# Patient Record
Sex: Female | Born: 1965 | Race: Asian | Hispanic: No | Marital: Married | Smoking: Former smoker
Health system: Southern US, Community
[De-identification: ages and names within clinical notes are randomized; demographics above are authoritative.]

## PROBLEM LIST (undated history)

## (undated) DIAGNOSIS — E079 Disorder of thyroid, unspecified: Secondary | ICD-10-CM

## (undated) DIAGNOSIS — K449 Diaphragmatic hernia without obstruction or gangrene: Secondary | ICD-10-CM

## (undated) DIAGNOSIS — K219 Gastro-esophageal reflux disease without esophagitis: Secondary | ICD-10-CM

## (undated) HISTORY — DX: Gastro-esophageal reflux disease without esophagitis: K21.9

## (undated) HISTORY — DX: Diaphragmatic hernia without obstruction or gangrene: K44.9

## (undated) HISTORY — DX: Disorder of thyroid, unspecified: E07.9

---

## 2019-06-17 DIAGNOSIS — M65312 Trigger thumb, left thumb: Secondary | ICD-10-CM | POA: Diagnosis not present

## 2019-10-07 ENCOUNTER — Ambulatory Visit: Payer: BC Managed Care – PPO | Attending: Internal Medicine

## 2019-10-07 DIAGNOSIS — Z23 Encounter for immunization: Secondary | ICD-10-CM

## 2019-10-07 NOTE — Progress Notes (Signed)
   Covid-19 Vaccination Clinic  Name:  Kenniyah Sasaki    MRN: [id number] DOB: [birth year]  10/07/2019  Ms. Strawder was observed post Covid-19 immunization for 15 minutes without incident. She was provided with Vaccine Information Sheet and instruction to access the V-Safe system.   Ms. Nutter was instructed to call 911 with any severe reactions post vaccine: Marland Kitchen Difficulty breathing  . Swelling of face and throat  . A fast heartbeat  . A bad rash all over body  . Dizziness and weakness   Immunizations Administered    Name Date Dose VIS Date Route   Pfizer COVID-19 Vaccine 10/07/2019 11:14 AM 0.3 mL 06/17/2019 Intramuscular   Manufacturer: [employer], [employer]   Lot: [id number]   NDC: [address]-4

## 2019-10-31 ENCOUNTER — Ambulatory Visit: Payer: BC Managed Care – PPO | Attending: Internal Medicine

## 2019-10-31 DIAGNOSIS — Z23 Encounter for immunization: Secondary | ICD-10-CM

## 2019-10-31 NOTE — Progress Notes (Signed)
   Covid-19 Vaccination Clinic  Name:  Joy Cohen    MRN: [id number] DOB: 25-Dec-1965  10/31/2019  [name] was observed post Covid-19 immunization for 15 minutes without incident. She was provided with Vaccine Information Sheet and instruction to access the V-Safe system.   [name] was instructed to call 911 with any severe reactions post vaccine: Marland Kitchen Difficulty breathing  . Swelling of face and throat  . A fast heartbeat  . A bad rash all over body  . Dizziness and weakness   Immunizations Administered    Name Date Dose VIS Date Route   Pfizer COVID-19 Vaccine 10/31/2019  1:05 PM 0.3 mL 08/31/2018 Intramuscular   Manufacturer: [employer], [employer]   Lot: [id number]   NDC: [address]-3

## 2020-02-28 DIAGNOSIS — S90862A Insect bite (nonvenomous), left foot, initial encounter: Secondary | ICD-10-CM | POA: Diagnosis not present

## 2020-04-19 DIAGNOSIS — Z03818 Encounter for observation for suspected exposure to other biological agents ruled out: Secondary | ICD-10-CM | POA: Diagnosis not present

## 2020-05-06 ENCOUNTER — Encounter (HOSPITAL_BASED_OUTPATIENT_CLINIC_OR_DEPARTMENT_OTHER): Payer: Self-pay | Admitting: Emergency Medicine

## 2020-05-06 ENCOUNTER — Emergency Department (HOSPITAL_BASED_OUTPATIENT_CLINIC_OR_DEPARTMENT_OTHER)
Admission: EM | Admit: 2020-05-06 | Discharge: 2020-05-07 | Disposition: A | Discharge status: Home / Self Care | Payer: BC Managed Care – PPO | Attending: Emergency Medicine | Admitting: Emergency Medicine

## 2020-05-06 ENCOUNTER — Other Ambulatory Visit: Payer: Self-pay

## 2020-05-06 DIAGNOSIS — Y92410 Unspecified street and highway as the place of occurrence of the external cause: Secondary | ICD-10-CM | POA: Diagnosis not present

## 2020-05-06 DIAGNOSIS — R079 Chest pain, unspecified: Secondary | ICD-10-CM | POA: Diagnosis not present

## 2020-05-06 DIAGNOSIS — S3992XA Unspecified injury of lower back, initial encounter: Secondary | ICD-10-CM | POA: Diagnosis not present

## 2020-05-06 DIAGNOSIS — S39012A Strain of muscle, fascia and tendon of lower back, initial encounter: Secondary | ICD-10-CM | POA: Diagnosis not present

## 2020-05-06 DIAGNOSIS — R0789 Other chest pain: Secondary | ICD-10-CM | POA: Diagnosis not present

## 2020-05-06 MED ORDER — IBUPROFEN 400 MG PO TABS
600.0000 mg | ORAL_TABLET | Freq: Once | ORAL | Status: AC
  Administered 2020-05-07: 600 mg via ORAL
  Filled 2020-05-06: qty 1

## 2020-05-06 MED ORDER — CYCLOBENZAPRINE HCL 5 MG PO TABS
5.0000 mg | ORAL_TABLET | Freq: Once | ORAL | Status: AC
  Administered 2020-05-07: 5 mg via ORAL
  Filled 2020-05-06: qty 1
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
## 2020-05-07 ENCOUNTER — Emergency Department (HOSPITAL_BASED_OUTPATIENT_CLINIC_OR_DEPARTMENT_OTHER): Payer: BC Managed Care – PPO

## 2020-05-07 DIAGNOSIS — R079 Chest pain, unspecified: Secondary | ICD-10-CM | POA: Diagnosis not present

## 2020-05-07 MED ORDER — IBUPROFEN 600 MG PO TABS: 600.0000 mg | ORAL_TABLET | Freq: Four times a day (QID) | ORAL | 0 refills | Status: DC | PRN

## 2020-05-07 MED ORDER — CYCLOBENZAPRINE HCL 5 MG PO TABS: 5.0000 mg | ORAL_TABLET | Freq: Two times a day (BID) | ORAL | 0 refills | Status: DC | PRN
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
## 2020-06-05 DIAGNOSIS — M545 Low back pain, unspecified: Secondary | ICD-10-CM | POA: Diagnosis not present

## 2020-06-14 DIAGNOSIS — M9904 Segmental and somatic dysfunction of sacral region: Secondary | ICD-10-CM | POA: Diagnosis not present

## 2020-06-14 DIAGNOSIS — M9903 Segmental and somatic dysfunction of lumbar region: Secondary | ICD-10-CM | POA: Diagnosis not present

## 2020-06-14 DIAGNOSIS — M9905 Segmental and somatic dysfunction of pelvic region: Secondary | ICD-10-CM | POA: Diagnosis not present

## 2020-06-14 DIAGNOSIS — M6240 Contracture of muscle, unspecified site: Secondary | ICD-10-CM | POA: Diagnosis not present

## 2020-06-19 DIAGNOSIS — M9904 Segmental and somatic dysfunction of sacral region: Secondary | ICD-10-CM | POA: Diagnosis not present

## 2020-06-19 DIAGNOSIS — M6240 Contracture of muscle, unspecified site: Secondary | ICD-10-CM | POA: Diagnosis not present

## 2020-06-19 DIAGNOSIS — M9905 Segmental and somatic dysfunction of pelvic region: Secondary | ICD-10-CM | POA: Diagnosis not present

## 2020-06-19 DIAGNOSIS — M9903 Segmental and somatic dysfunction of lumbar region: Secondary | ICD-10-CM | POA: Diagnosis not present

## 2020-07-02 DIAGNOSIS — M25551 Pain in right hip: Secondary | ICD-10-CM | POA: Diagnosis not present

## 2020-07-02 DIAGNOSIS — M545 Low back pain, unspecified: Secondary | ICD-10-CM | POA: Diagnosis not present

## 2020-12-27 DIAGNOSIS — R5383 Other fatigue: Secondary | ICD-10-CM | POA: Diagnosis not present

## 2020-12-27 DIAGNOSIS — R0789 Other chest pain: Secondary | ICD-10-CM | POA: Diagnosis not present

## 2021-01-09 ENCOUNTER — Other Ambulatory Visit: Payer: Self-pay | Admitting: Family Medicine

## 2021-01-09 ENCOUNTER — Other Ambulatory Visit: Payer: Self-pay

## 2021-01-09 ENCOUNTER — Ambulatory Visit
Admission: RE | Admit: 2021-01-09 | Discharge: 2021-01-09 | Disposition: A | Discharge status: Home / Self Care | Payer: BC Managed Care – PPO | Source: Ambulatory Visit | Attending: Family Medicine | Admitting: Family Medicine

## 2021-01-09 DIAGNOSIS — R1031 Right lower quadrant pain: Secondary | ICD-10-CM | POA: Diagnosis not present

## 2021-01-09 DIAGNOSIS — K449 Diaphragmatic hernia without obstruction or gangrene: Secondary | ICD-10-CM | POA: Diagnosis not present

## 2021-01-09 DIAGNOSIS — R5383 Other fatigue: Secondary | ICD-10-CM | POA: Diagnosis not present

## 2021-01-09 DIAGNOSIS — R946 Abnormal results of thyroid function studies: Secondary | ICD-10-CM | POA: Diagnosis not present

## 2021-01-09 DIAGNOSIS — R0789 Other chest pain: Secondary | ICD-10-CM | POA: Diagnosis not present

## 2021-01-09 DIAGNOSIS — M5127 Other intervertebral disc displacement, lumbosacral region: Secondary | ICD-10-CM | POA: Diagnosis not present

## 2021-01-09 MED ORDER — IOPAMIDOL (ISOVUE-300) INJECTION 61%
100.0000 mL | Freq: Once | INTRAVENOUS | Status: AC | PRN
  Administered 2021-01-09: 100 mL via INTRAVENOUS

## 2021-01-09 MED ORDER — IOPAMIDOL (ISOVUE-300) INJECTION 61%: 75.0000 mL | Freq: Once | INTRAVENOUS | Status: DC | PRN

## 2021-01-14 NOTE — Progress Notes (Deleted)
Date:  01/14/2021   ID:  Joy Cohen, DOB [birth year], MRN [id number]  PCP:  Clayborn Heron, MD  Cardiologist: Tessa Lerner, DO, [facility]  (established care 01/15/2021) Former Cardiology Providers: ***  REASON FOR CONSULT: Chest pain, shortness of breath  REQUESTING PHYSICIAN:  Rankins, Fanny Dance, MD [address] [place],  [place] [address]  No chief complaint on file.   HPI  Joy Cohen is a 55 y.o. female who presents to the office with a chief complaint of "***." Patient's past medical history and cardiovascular risk factors include: ***  She is referred to the office at the request of Rankins, Fanny Dance, MD for evaluation of chest pain, shortness of breath.  ***  History of  Denies prior history of coronary artery disease, myocardial infarction, congestive heart failure, deep venous thrombosis, pulmonary embolism, stroke, transient ischemic attack.  FUNCTIONAL STATUS: ***   ALLERGIES: Not on File  MEDICATION LIST PRIOR TO VISIT: No outpatient medications have been marked as taking for the 01/15/21 encounter (Appointment) with Tessa Lerner, DO.     PAST MEDICAL HISTORY: No past medical history on file.  PAST SURGICAL HISTORY: No past surgical history on file.  FAMILY HISTORY: The patient family history is not on file.  SOCIAL HISTORY:  The patient  reports that she has never smoked. She has never used smokeless tobacco. She reports that she does not drink alcohol and does not use drugs.  REVIEW OF SYSTEMS: ROS  PHYSICAL EXAM: Vitals with BMI 05/07/2020 05/06/2020  Height - 5\' 1"   Weight - 149 lbs  BMI - 28.17  Systolic 132 143  Diastolic 81 90  Pulse 61 67    CONSTITUTIONAL: Well-developed and well-nourished. No acute distress.  SKIN: Skin is warm and dry. No rash noted. No cyanosis. No pallor. No jaundice HEAD: Normocephalic and atraumatic.  EYES: No scleral icterus MOUTH/THROAT: Moist oral membranes.  NECK: No JVD present.  No thyromegaly noted. No carotid bruits  LYMPHATIC: No visible cervical adenopathy.  CHEST Normal respiratory effort. No intercostal retractions  LUNGS: *** No stridor. No wheezes. No rales.  CARDIOVASCULAR: *** ABDOMINAL: No apparent ascites.  EXTREMITIES: No peripheral edema  HEMATOLOGIC: No significant bruising NEUROLOGIC: Oriented to person, place, and time. Nonfocal. Normal muscle tone.  PSYCHIATRIC: Normal mood and affect. Normal behavior. Cooperative  CARDIAC DATABASE: EKG: 05/08/2020:   Echocardiogram: No results found for this or any previous visit from the past 1095 days.    Stress Testing: No results found for this or any previous visit from the past 1095 days.   Heart Catheterization: ***  LABORATORY DATA: No flowsheet data found.  No flowsheet data found.  Lipid Panel  No results found for: CHOL, TRIG, HDL, CHOLHDL, VLDL, LDLCALC, LDLDIRECT, LABVLDL  No components found for: NTPROBNP No results for input(s): PROBNP in the last 8760 hours. No results for input(s): TSH in the last 8760 hours.  BMP No results for input(s): NA, K, CL, CO2, GLUCOSE, BUN, CREATININE, CALCIUM, GFRNONAA, GFRAA in the last 8760 hours.  HEMOGLOBIN A1C No results found for: HGBA1C, MPG  IMPRESSION:  No diagnosis found.   RECOMMENDATIONS: Andersyn Fragoso is a 55 y.o. female whose past medical history and cardiac risk factors include: ***   FINAL MEDICATION LIST END OF ENCOUNTER: No orders of the defined types were placed in this encounter.   There are no discontinued medications.   Current Outpatient Medications:    cyclobenzaprine (FLEXERIL) 5 MG tablet, Take 1 tablet (5 mg  total) by mouth 2 (two) times daily as needed for muscle spasms., Disp: 10 tablet, Rfl: 0   ibuprofen (ADVIL) 600 MG tablet, Take 1 tablet (600 mg total) by mouth every 6 (six) hours as needed., Disp: 30 tablet, Rfl: 0  No orders of the defined types were placed in this encounter.   There are  no Patient Instructions on file for this visit.   --Continue cardiac medications as reconciled in final medication list. --No follow-ups on file. Or sooner if needed. --Continue follow-up with your primary care physician regarding the management of your other chronic comorbid conditions.  Patient's questions and concerns were addressed to her satisfaction. She voices understanding of the instructions provided during this encounter.   This note was created using a voice recognition software as a result there may be grammatical errors inadvertently enclosed that do not reflect the nature of this encounter. Every attempt is made to correct such errors.  Tessa Lerner, [place], [facility]  Pager: [phone] Office: [phone]

## 2021-01-15 ENCOUNTER — Ambulatory Visit: Payer: BC Managed Care – PPO | Admitting: Cardiology

## 2021-01-15 ENCOUNTER — Other Ambulatory Visit: Payer: Self-pay

## 2021-01-15 ENCOUNTER — Telehealth: Payer: BC Managed Care – PPO | Admitting: Cardiology

## 2021-01-15 ENCOUNTER — Encounter: Payer: Self-pay | Admitting: Cardiology

## 2021-01-15 VITALS — BP 129/71 | HR 61 | Ht 61.0 in | Wt 148.0 lb

## 2021-01-15 DIAGNOSIS — Z87891 Personal history of nicotine dependence: Secondary | ICD-10-CM

## 2021-01-15 DIAGNOSIS — E0590 Thyrotoxicosis, unspecified without thyrotoxic crisis or storm: Secondary | ICD-10-CM | POA: Diagnosis not present

## 2021-01-15 DIAGNOSIS — R072 Precordial pain: Secondary | ICD-10-CM | POA: Diagnosis not present

## 2021-01-15 DIAGNOSIS — R0602 Shortness of breath: Secondary | ICD-10-CM

## 2021-01-15 NOTE — Progress Notes (Signed)
Joy Cohen Date of Birth: 1965/11/06 MRN: [id number] Primary Care Provider:Rankins, Bill Salinas, MD Primary Cardiologist: Rex Kras, DO, [facility] (established care 01/15/2021)  Reason of consultation: Chest pain and shortness of breath and decreased exercise endurance  Date: 01/15/21  Cardiology Video Visit via Telehealth Patient encounter was conducted via Video Visit with real-time audio and video communication.  The patient has been advised of the potential risks and limitations of this mode of treatment.  The patient has also been advised to contact this office for worsening conditions or problems, and seek emergency medical treatment and/or call 911 if the patient deems either necessary.  Patient Location: Home Provider Location: Office/Clinic  Chief Complaint  Patient presents with   Shortness of Breath   Chest Pain   Fatigue    HPI  Joy Cohen is a 55 y.o.  female evaluated today via a video visit with a chief complaint of "Chest pain, shortness of breath." Patient's past medical history and cardiovascular risk factors include: History of thyrotoxicosis, former smoker, GERD, Hiatal hernia.   Chest pain: Symptoms of chest pain have been present for the last 3 weeks at least.  She describes the discomfort as a pressure-like sensation located substernally, it last for several hours if not all day, the intensity is 0-1 out of 10, not brought on by effort related activities but at times brought on by stressful situations, and does not resolve with rest is usually self-limited.  She is also noticed that when she walks her dog twice a day for 30 minutes each she has been experiencing more dyspnea on exertion.  She has underlying hyperthyroidism and most recent TSH was 0.18.  She used to take medications for her hyperthyroidism but recently had discontinued as she was focusing on lifestyle changes.  She has an upcoming appointment with endocrinology for further  evaluation and management.  No prior history of coronary artery disease.  FUNCTIONAL STATUS: Walks 60 minutes a day.    ALLERGIES: No Known Allergies   MEDICATION LIST PRIOR TO VISIT: Current Outpatient Medications on File Prior to Visit  Medication Sig Dispense Refill   Ascorbic Acid (VITAMIN C) 1000 MG tablet Take 1,000 mg by mouth daily.     Zinc 50 MG TABS Take 1 tablet by mouth daily at 6 (six) AM.     No current facility-administered medications on file prior to visit.   Pre-visit medications were reviewed and reconciled by rooming nurse, and independently reviewed and reconciled by me at the onset of this encounter. Any new changes in medication names or instructions are found in Final Medication List  PAST MEDICAL HISTORY: Past Medical History:  Diagnosis Date   Thyroid disease     PAST SURGICAL HISTORY: Past Surgical History:  Procedure Laterality Date   CESAREAN SECTION      FAMILY HISTORY: No family history of premature coronary disease or sudden cardiac death.   SOCIAL HISTORY:  The patient  reports that she quit smoking about 27 years ago. Her smoking use included cigarettes. She has a 5.00 pack-year smoking history. She has never used smokeless tobacco. She reports that she does not drink alcohol and does not use drugs.  Review of Systems  Constitutional: Negative for chills and fever.  HENT:  Negative for hoarse voice and nosebleeds.   Eyes:  Negative for discharge, double vision and pain.  Cardiovascular:  Positive for chest pain and dyspnea on exertion. Negative for claudication, leg swelling, near-syncope, orthopnea, palpitations, paroxysmal nocturnal dyspnea and  syncope.  Respiratory:  Positive for shortness of breath. Negative for hemoptysis.   Musculoskeletal:  Negative for muscle cramps and myalgias.  Gastrointestinal:  Negative for abdominal pain, constipation, diarrhea, hematemesis, hematochezia, melena, nausea and vomiting.  Neurological:   Negative for dizziness and light-headedness.   PHYSICAL EXAM: The following examination entries are based on visual observation of the patient via telemedicine video: CONSTITUTIONAL: No acute distress by facial expression or demeanor during the telemedicine video.  SKIN: No apparent cyanosis, pallor, jaundice of the face by telemedicine video. HEAD: Normocephalic and atraumatic by telemedicine video.  EYES: No apparent scleral icterus by telemedicine video. MOUTH/THROAT: Lips do not appear to be dry during telemedicine video. NECK: No apparent JVD or thyromegaly noted on observation of neck during telemedicine video. LUNGS: No audible stridor or wheezing noted during telemedicine audio. CARDIOVASCULAR: Not examined during telemedicine video NEUROLOGIC: No apparent focal asymmetry of face noted during telemedicine video PSYCHIATRIC: Normal mood and affect, normal behavior, cooperative during telemedicine video  CARDIAC DATABASE: EKG: 12/27/2020: (provided by PCP) Normal sinus rhythm, 64 bpm, normal axis, without underlying ischemia or injury pattern.  Echocardiogram: No results found for this or any previous visit from the past 1095 days.    Stress Testing:  No results found for this or any previous visit from the past 1095 days.   Heart Catheterization: None  LABORATORY DATA: No flowsheet data found.  No flowsheet data found.  Lipid Panel  No results found for: CHOL, TRIG, HDL, CHOLHDL, VLDL, LDLCALC, LDLDIRECT, LABVLDL  No results found for: HGBA1C No components found for: NTPROBNP No results found for: TSH  Cardiac Panel (last 3 results) No results for input(s): CKTOTAL, CKMB, TROPONINIHS, RELINDX in the last 72 hours.  External Labs: Collected: 12/27/2020 provided by PCP Creatinine 0.65 mg/dL. eGFR: >60 mL/min per 1.73 m Potassium 4.3 Hemoglobin 14.4 g/dL, hematocrit 42.1% TSH: 0.18  IMPRESSION:    ICD-10-CM   1. Precordial pain  R07.2     2. SOB  (shortness of breath)  R06.02     3. Hyperthyroidism  E05.90     4. Former cigarette smoker  Z87.891        RECOMMENDATIONS: [name] is a 55 y.o. female whose past medical history and cardiovascular risk factors include: History of thyrotoxicosis, former smoker, GERD, Hiatal hernia.   Of note, patient was suppose to come into the office for consultation.  However, her husband's been having COVID-like symptoms and therefore the visit was changed to telehealth to decrease exposure for possible COVID-19 infection.  Precordial pain: Her symptoms can possibly be cardiac in etiology and therefore would recommend additional work-up. Initially recommended coronary CTA to evaluate for coronary calcium as well as obstructive CAD.  However patient states that she recently had a CT of the abdomen and did not tolerate the test well. Echocardiogram will be ordered to evaluate for structural heart disease and left ventricular systolic function. Plan exercise treadmill stress test we will screen for COVID prior to her GXT. Coronary artery calcium scoring  Dyspnea on exertion: Ischemic evaluation as outlined above. Check NT proBNP. EKG on arrival at the next office visit  Hyperthyroidism: Patient plans to follow-up with endocrinology in the coming week.  Given her age would recommend checking a fasting lipid profile and a CMP prior to next office visit to help further risk stratify the patient from a cardiovascular standpoint.  FINAL MEDICATION LIST END OF ENCOUNTER: No orders of the defined types were placed in this encounter.  Medications Discontinued During This Encounter  Medication Reason   cyclobenzaprine (FLEXERIL) 5 MG tablet Error   ibuprofen (ADVIL) 600 MG tablet Error     Current Outpatient Medications:    Ascorbic Acid (VITAMIN C) 1000 MG tablet, Take 1,000 mg by mouth daily., Disp: , Rfl:    Zinc 50 MG TABS, Take 1 tablet by mouth daily at 6 (six) AM., Disp: , Rfl:    No orders of the defined types were placed in this encounter.   Time:   Today, I have spent 47 minutes with the patient with telehealth technology discussing the above problems. Review of outside records provided by her PCP as part of this consultation.  Independently reviewed labs.  Discussed the differential diagnosis of her presenting symptoms.  Going over the modalities of diagnostic testing, medical decision making, and coordination of care.  --Continue cardiac medications as reconciled in final medication list. --Return in about 4 weeks (around 02/12/2021) for Reevaluation of chest pain and shortness of breath and review test results. Or sooner if needed. --Continue follow-up with your primary care physician regarding the management of your other chronic comorbid conditions.  Patient's questions and concerns were addressed to her satisfaction. She voices understanding of the instructions provided during this encounter.   This note was created using a voice recognition software as a result there may be grammatical errors inadvertently enclosed that do not reflect the nature of this encounter. Every attempt is made to correct such errors.  Rex Kras, [place], [facility]  Pager: [phone] Office: [phone]

## 2021-01-22 ENCOUNTER — Other Ambulatory Visit: Payer: Self-pay

## 2021-01-22 DIAGNOSIS — R0602 Shortness of breath: Secondary | ICD-10-CM

## 2021-01-24 DIAGNOSIS — E0500 Thyrotoxicosis with diffuse goiter without thyrotoxic crisis or storm: Secondary | ICD-10-CM | POA: Diagnosis not present

## 2021-01-24 DIAGNOSIS — E0590 Thyrotoxicosis, unspecified without thyrotoxic crisis or storm: Secondary | ICD-10-CM | POA: Diagnosis not present

## 2021-01-24 DIAGNOSIS — R946 Abnormal results of thyroid function studies: Secondary | ICD-10-CM | POA: Diagnosis not present

## 2021-01-25 DIAGNOSIS — M9905 Segmental and somatic dysfunction of pelvic region: Secondary | ICD-10-CM | POA: Diagnosis not present

## 2021-01-25 DIAGNOSIS — M9903 Segmental and somatic dysfunction of lumbar region: Secondary | ICD-10-CM | POA: Diagnosis not present

## 2021-01-25 DIAGNOSIS — M6240 Contracture of muscle, unspecified site: Secondary | ICD-10-CM | POA: Diagnosis not present

## 2021-01-31 ENCOUNTER — Other Ambulatory Visit: Payer: Self-pay

## 2021-01-31 ENCOUNTER — Encounter: Payer: Self-pay | Admitting: Cardiology

## 2021-01-31 ENCOUNTER — Ambulatory Visit: Payer: BC Managed Care – PPO | Admitting: Cardiology

## 2021-01-31 VITALS — BP 106/67 | HR 66 | Temp 97.9°F | Resp 16 | Ht 61.0 in | Wt 149.0 lb

## 2021-01-31 DIAGNOSIS — R0602 Shortness of breath: Secondary | ICD-10-CM | POA: Diagnosis not present

## 2021-01-31 DIAGNOSIS — Z87891 Personal history of nicotine dependence: Secondary | ICD-10-CM | POA: Diagnosis not present

## 2021-01-31 DIAGNOSIS — R072 Precordial pain: Secondary | ICD-10-CM

## 2021-01-31 DIAGNOSIS — E0580 Other thyrotoxicosis without thyrotoxic crisis or storm: Secondary | ICD-10-CM

## 2021-01-31 NOTE — Progress Notes (Signed)
Date:  01/31/2021   ID:  [name], DOB 07-28-1965, MRN [id number]  PCP:  Aretta Nip, MD  Cardiologist:  Rex Kras, DO, [facility] (established care 01/15/2021)  Date: 01/31/21 Last Office Visit: 01/15/2021   Chief Complaint  Patient presents with   Precordial pain   Shortness of Breath    HPI  Joy Cohen is a 55 y.o. female who presents to the office with a chief complaint of "chest discomfort and shortness of breath." Patient's past medical history and cardiovascular risk factors include: History of thyrotoxicosis, currently hyperthyroidism, former smoker, GERD, Hiatal hernia.  She is referred to the office at the request of Rankins, Bill Salinas, MD for evaluation of Chest pain and shortness of breath.  Chest discomfort: Patient states that she continues to have epigastric discomfort, intensity 1 out of 10, lasting for a few seconds, not brought on by effort related activities, does not resolve with rest.  Patient states that symptoms usually improve after massaging that area for a few seconds.  She continues to walk her dog at least twice a day without a decrease in physical endurance.  At times she does experience effort related dyspnea.  During her last virtual visit she was recommended to undergo an echocardiogram, GXT, and coronary calcium score; however, they are all pending.  We initially considered doing a coronary CTA given her symptoms however, patient did not want to proceed forward because she did not feel well after recent CT of the abdomen.  FUNCTIONAL STATUS: Walks her dog twice a day for total of 60 minutes.    ALLERGIES: No Known Allergies  MEDICATION LIST PRIOR TO VISIT: Current Meds  Medication Sig   Ascorbic Acid (VITAMIN C) 1000 MG tablet Take 1,000 mg by mouth daily.   methimazole (TAPAZOLE) 5 MG tablet Take 1 tablet by mouth daily at 12 noon.   Zinc 50 MG TABS Take 1 tablet by mouth daily at 6 (six) AM.     PAST MEDICAL  HISTORY: Past Medical History:  Diagnosis Date   GERD (gastroesophageal reflux disease)    Hiatal hernia    Thyroid disease     PAST SURGICAL HISTORY: Past Surgical History:  Procedure Laterality Date   CESAREAN SECTION      FAMILY HISTORY: The patient family history includes Stroke in her father. No family history of premature coronary disease or sudden cardiac death.   SOCIAL HISTORY:  The patient  reports that she quit smoking about 27 years ago. Her smoking use included cigarettes. She has a 5.00 pack-year smoking history. She has never used smokeless tobacco. She reports that she does not drink alcohol and does not use drugs.  REVIEW OF SYSTEMS: Review of Systems  Constitutional: Negative for chills and fever.  HENT:  Negative for hoarse voice and nosebleeds.   Eyes:  Negative for discharge, double vision and pain.  Cardiovascular:  Positive for chest pain. Negative for claudication, dyspnea on exertion, leg swelling, near-syncope, orthopnea, palpitations, paroxysmal nocturnal dyspnea and syncope.  Respiratory:  Positive for shortness of breath. Negative for hemoptysis.   Musculoskeletal:  Negative for muscle cramps and myalgias.  Gastrointestinal:  Negative for abdominal pain, constipation, diarrhea, hematemesis, hematochezia, melena, nausea and vomiting.  Neurological:  Negative for dizziness and light-headedness.   PHYSICAL EXAM: Vitals with BMI 01/31/2021 01/15/2021 05/07/2020  Height 5' 1"  5' 1"  -  Weight 149 lbs 148 lbs -  BMI 38.46 65.99 -  Systolic 357 017 793  Diastolic 67 71 81  Pulse 66 61 61    CONSTITUTIONAL: Well-developed and well-nourished. No acute distress.  SKIN: Skin is warm and dry. No rash noted. No cyanosis. No pallor. No jaundice HEAD: Normocephalic and atraumatic.  EYES: No scleral icterus MOUTH/THROAT: Moist oral membranes.  NECK: No JVD present. No thyromegaly noted. No carotid bruits  LYMPHATIC: No visible cervical adenopathy.  CHEST  Normal respiratory effort. No intercostal retractions  LUNGS: Clear to auscultation bilaterally.  No stridor. No wheezes. No rales.  CARDIOVASCULAR: Regular rate and rhythm, positive S1-S2, no murmurs rubs or gallops appreciated. ABDOMINAL: Nonobese, soft, nontender, nondistended, positive bowel sounds in all 4 quadrants, no apparent ascites.  EXTREMITIES: No peripheral edema, 2+ DP and PT pulses bilaterally. HEMATOLOGIC: No significant bruising NEUROLOGIC: Oriented to person, place, and time. Nonfocal. Normal muscle tone.  PSYCHIATRIC: Normal mood and affect. Normal behavior. Cooperative  CARDIAC DATABASE: EKG: 01/31/2021: Sinus bradycardia, 58 bpm, normal axis, without underlying ischemia or injury pattern.   Echocardiogram: No results found for this or any previous visit from the past 1095 days.   Stress Testing: No results found for this or any previous visit from the past 1095 days.  Heart Catheterization: None  LABORATORY DATA: No flowsheet data found.  No flowsheet data found.  Lipid Panel  No results found for: CHOL, TRIG, HDL, CHOLHDL, VLDL, LDLCALC, LDLDIRECT, LABVLDL  No components found for: NTPROBNP No results for input(s): PROBNP in the last 8760 hours. No results for input(s): TSH in the last 8760 hours.  BMP No results for input(s): NA, K, CL, CO2, GLUCOSE, BUN, CREATININE, CALCIUM, GFRNONAA, GFRAA in the last 8760 hours.  HEMOGLOBIN A1C No results found for: HGBA1C, MPG  External Labs: Collected: 12/27/2020 provided by PCP Creatinine 0.65 mg/dL. eGFR: >60 mL/min per 1.73 m Potassium 4.3 Hemoglobin 14.4 g/dL, hematocrit 42.1% TSH: 0.18  IMPRESSION:    ICD-10-CM   1. Precordial pain  R07.2 EKG 12-Lead    2. SOB (shortness of breath)  R06.02     3. Iatrogenic hyperthyroidism  E05.80     4. Former cigarette smoker  Z87.891        RECOMMENDATIONS: Joy Cohen is a 55 y.o. female whose past medical history and cardiac risk factors  include: History of thyrotoxicosis, currently hyperthyroidism, former smoker, GERD, Hiatal hernia.  Precordial pain: Patient symptoms have not worsened in intensity, frequency, and/or duration. Most likely noncardiac in etiology. Echocardiogram, exercise treadmill stress test, and coronary calcium score are currently pending.  Shortness of breath: Overall appears to be euvolemic and not in congestive heart failure. Check NT proBNP. Ischemic work-up as outlined above  Hyperthyroidism: Currently on methimazole, follows with endocrinology.  Former smoker: Educated on the importance of continued smoking cessation.  FINAL MEDICATION LIST END OF ENCOUNTER: No orders of the defined types were placed in this encounter.   There are no discontinued medications.   Current Outpatient Medications:    Ascorbic Acid (VITAMIN C) 1000 MG tablet, Take 1,000 mg by mouth daily., Disp: , Rfl:    methimazole (TAPAZOLE) 5 MG tablet, Take 1 tablet by mouth daily at 12 noon., Disp: , Rfl:    Zinc 50 MG TABS, Take 1 tablet by mouth daily at 6 (six) AM., Disp: , Rfl:   Orders Placed This Encounter  Procedures   EKG 12-Lead    There are no Patient Instructions on file for this visit.   --Continue cardiac medications as reconciled in final medication list. --Return in about 4 weeks (around 02/28/2021) for Follow up, Chest  pain, Dyspnea, Review test results. Or sooner if needed. --Continue follow-up with your primary care physician regarding the management of your other chronic comorbid conditions.  Patient's questions and concerns were addressed to her satisfaction. She voices understanding of the instructions provided during this encounter.   This note was created using a voice recognition software as a result there may be grammatical errors inadvertently enclosed that do not reflect the nature of this encounter. Every attempt is made to correct such errors.  Rex Kras, [place], [facility]  Pager:  [phone] Office: [phone]

## 2021-02-10 DIAGNOSIS — Z20822 Contact with and (suspected) exposure to covid-19: Secondary | ICD-10-CM | POA: Diagnosis not present

## 2021-02-11 ENCOUNTER — Other Ambulatory Visit: Payer: BC Managed Care – PPO

## 2021-02-25 DIAGNOSIS — M9903 Segmental and somatic dysfunction of lumbar region: Secondary | ICD-10-CM | POA: Diagnosis not present

## 2021-02-25 DIAGNOSIS — M6240 Contracture of muscle, unspecified site: Secondary | ICD-10-CM | POA: Diagnosis not present

## 2021-02-25 DIAGNOSIS — M9905 Segmental and somatic dysfunction of pelvic region: Secondary | ICD-10-CM | POA: Diagnosis not present

## 2021-02-26 ENCOUNTER — Ambulatory Visit: Payer: BC Managed Care – PPO

## 2021-02-26 ENCOUNTER — Other Ambulatory Visit: Payer: Self-pay

## 2021-02-26 DIAGNOSIS — R072 Precordial pain: Secondary | ICD-10-CM | POA: Diagnosis not present

## 2021-03-05 DIAGNOSIS — M9903 Segmental and somatic dysfunction of lumbar region: Secondary | ICD-10-CM | POA: Diagnosis not present

## 2021-03-05 DIAGNOSIS — M9905 Segmental and somatic dysfunction of pelvic region: Secondary | ICD-10-CM | POA: Diagnosis not present

## 2021-03-05 DIAGNOSIS — M6240 Contracture of muscle, unspecified site: Secondary | ICD-10-CM | POA: Diagnosis not present

## 2021-03-08 ENCOUNTER — Other Ambulatory Visit: Payer: Self-pay

## 2021-03-08 ENCOUNTER — Ambulatory Visit: Payer: BC Managed Care – PPO

## 2021-03-08 DIAGNOSIS — R072 Precordial pain: Secondary | ICD-10-CM | POA: Diagnosis not present

## 2021-03-12 NOTE — Progress Notes (Signed)
Called and spoke with patient regarding her echocardiogram and stress test results. Patient will be here on the 12th @ 11:30 for her appointment.

## 2021-03-12 NOTE — Progress Notes (Signed)
Called and spoke with patient regarding her echocardiogram and stress test results. Patient will be here on the 12th @ 11:30 for her appointment.

## 2021-03-14 DIAGNOSIS — M6240 Contracture of muscle, unspecified site: Secondary | ICD-10-CM | POA: Diagnosis not present

## 2021-03-14 DIAGNOSIS — M9905 Segmental and somatic dysfunction of pelvic region: Secondary | ICD-10-CM | POA: Diagnosis not present

## 2021-03-14 DIAGNOSIS — M9903 Segmental and somatic dysfunction of lumbar region: Secondary | ICD-10-CM | POA: Diagnosis not present

## 2021-03-18 ENCOUNTER — Other Ambulatory Visit: Payer: Self-pay

## 2021-03-18 ENCOUNTER — Ambulatory Visit: Payer: BC Managed Care – PPO | Admitting: Cardiology

## 2021-03-18 ENCOUNTER — Encounter: Payer: Self-pay | Admitting: Cardiology

## 2021-03-18 VITALS — BP 105/67 | HR 76 | Temp 98.0°F | Resp 17 | Ht 61.0 in | Wt 150.0 lb

## 2021-03-18 DIAGNOSIS — I340 Nonrheumatic mitral (valve) insufficiency: Secondary | ICD-10-CM | POA: Diagnosis not present

## 2021-03-18 DIAGNOSIS — R9439 Abnormal result of other cardiovascular function study: Secondary | ICD-10-CM | POA: Diagnosis not present

## 2021-03-18 DIAGNOSIS — Z87891 Personal history of nicotine dependence: Secondary | ICD-10-CM

## 2021-03-18 DIAGNOSIS — E0590 Thyrotoxicosis, unspecified without thyrotoxic crisis or storm: Secondary | ICD-10-CM

## 2021-03-18 DIAGNOSIS — R072 Precordial pain: Secondary | ICD-10-CM

## 2021-03-18 NOTE — Progress Notes (Signed)
Date:  03/18/2021   ID:  [name], DOB 09-08-1965, MRN [id number]  PCP:  Aretta Nip, MD  Cardiologist:  Rex Kras, DO, [facility] (established care 01/15/2021)  Date: 03/18/21 Last Office Visit: 01/31/2021  Chief Complaint  Patient presents with   Precordial pain   Follow-up   Results    Stress and echo    HPI  Joy Cohen is a 55 y.o. female who presents to the office with a chief complaint of "chest pain and review test results." Patient's past medical history and cardiovascular risk factors include: History of thyrotoxicosis, currently hyperthyroidism, former smoker, GERD, Hiatal hernia.  She is referred to the office at the request of Rankins, Bill Salinas, MD for evaluation of Chest pain and shortness of breath.  Patient was referred to the office for evaluation of chest pain and shortness of breath.  Due to the language barrier patient's husband is currently on FaceTime for further continuity of care.  During last office visit patient was complaining of epigastric discomfort, lasting for a few seconds, intensity 1 out of 10, usually associated with after eating.  The discomfort was getting attributed to heartburn and hiatal hernia.  She did not have any effort related symptoms of chest pain.  Since last visit she has undergone an echo and exercise treadmill stress test.  Results reviewed with her in great detail and noted below for further reference.  Clinically patient states that she continues to have episodic chest discomfort, substernally, usually after eating, feels like a pressure-like sensation, usually self-limited after few seconds.  She does not have any effort related chest pain or shortness of breath.  FUNCTIONAL STATUS: Walks her dog twice a day for total of 60 minutes.    ALLERGIES: No Known Allergies  MEDICATION LIST PRIOR TO VISIT: Current Meds  Medication Sig   Ascorbic Acid (VITAMIN C) 1000 MG tablet Take 1,000 mg by mouth daily.    methimazole (TAPAZOLE) 5 MG tablet Take 1 tablet by mouth daily at 12 noon.   Zinc 50 MG TABS Take 1 tablet by mouth daily at 6 (six) AM.     PAST MEDICAL HISTORY: Past Medical History:  Diagnosis Date   GERD (gastroesophageal reflux disease)    Hiatal hernia    Thyroid disease     PAST SURGICAL HISTORY: Past Surgical History:  Procedure Laterality Date   CESAREAN SECTION      FAMILY HISTORY: The patient family history includes Stroke in her father. No family history of premature coronary disease or sudden cardiac death.   SOCIAL HISTORY:  The patient  reports that she quit smoking about 27 years ago. Her smoking use included cigarettes. She has a 5.00 pack-year smoking history. She has never used smokeless tobacco. She reports that she does not drink alcohol and does not use drugs.  REVIEW OF SYSTEMS: Review of Systems  Constitutional: Negative for chills and fever.  HENT:  Negative for hoarse voice and nosebleeds.   Eyes:  Negative for discharge, double vision and pain.  Cardiovascular:  Positive for chest pain. Negative for claudication, dyspnea on exertion, leg swelling, near-syncope, orthopnea, palpitations, paroxysmal nocturnal dyspnea and syncope.  Respiratory:  Negative for hemoptysis and shortness of breath.   Musculoskeletal:  Negative for muscle cramps and myalgias.  Gastrointestinal:  Negative for abdominal pain, constipation, diarrhea, hematemesis, hematochezia, melena, nausea and vomiting.  Neurological:  Negative for dizziness and light-headedness.   PHYSICAL EXAM: Vitals with BMI 03/18/2021 01/31/2021 01/15/2021  Height 5' 1"   5' 1"  5' 1"   Weight 150 lbs 149 lbs 148 lbs  BMI 28.36 16.10 96.04  Systolic 540 981 191  Diastolic 67 67 71  Pulse 76 66 61    CONSTITUTIONAL: Well-developed and well-nourished. No acute distress.  SKIN: Skin is warm and dry. No rash noted. No cyanosis. No pallor. No jaundice HEAD: Normocephalic and atraumatic.  EYES: No scleral  icterus MOUTH/THROAT: Moist oral membranes.  NECK: No JVD present. No thyromegaly noted. No carotid bruits  LYMPHATIC: No visible cervical adenopathy.  CHEST Normal respiratory effort. No intercostal retractions  LUNGS: Clear to auscultation bilaterally.  No stridor. No wheezes. No rales.  CARDIOVASCULAR: Regular rate and rhythm, positive S1-S2, no murmurs rubs or gallops appreciated. ABDOMINAL: Nonobese, soft, nontender, nondistended, positive bowel sounds in all 4 quadrants, no apparent ascites.  EXTREMITIES: No peripheral edema, 2+ DP and PT pulses bilaterally. HEMATOLOGIC: No significant bruising NEUROLOGIC: Oriented to person, place, and time. Nonfocal. Normal muscle tone.  PSYCHIATRIC: Normal mood and affect. Normal behavior. Cooperative  CARDIAC DATABASE: EKG: 01/31/2021: Sinus bradycardia, 58 bpm, normal axis, without underlying ischemia or injury pattern.   Echocardiogram: 02/26/2021: Left ventricle cavity is normal in size and wall thickness. Normal global wall motion. Normal LV systolic function with EF 63%. Normal diastolic filling pattern.  Structurally normal trileaflet aortic valve. Mild (Grade I) aortic regurgitation. Mild (Grade I) mitral regurgitation. Mild tricuspid regurgitation.  No evidence of pulmonary hypertension.   Stress Testing: Exercise treadmill stress test 03/08/2021: Exercise treadmill stress test performed using Bruce protocol. Patient reached 10.1 METS, and 96% of age predicted maximum heart rate. Exercise capacity was good. No chest pain reported. Normal heart rate and hemodynamic response.  Stress EKG showed sinus tachycardia, 1 mm horizontal or downsloping ST depressions in leads II, III, aVF, that normalize within 1 min into recovery. EKG changes are equivocal for ischemia.  Low risk study.  Heart Catheterization: None  LABORATORY DATA: No flowsheet data found.  No flowsheet data found.  Lipid Panel  No results found for: CHOL, TRIG, HDL,  CHOLHDL, VLDL, LDLCALC, LDLDIRECT, LABVLDL  No components found for: NTPROBNP No results for input(s): PROBNP in the last 8760 hours. No results for input(s): TSH in the last 8760 hours.  BMP No results for input(s): NA, K, CL, CO2, GLUCOSE, BUN, CREATININE, CALCIUM, GFRNONAA, GFRAA in the last 8760 hours.  HEMOGLOBIN A1C No results found for: HGBA1C, MPG  External Labs: Collected: 12/27/2020 provided by PCP Creatinine 0.65 mg/dL. eGFR: >60 mL/min per 1.73 m Potassium 4.3 Hemoglobin 14.4 g/dL, hematocrit 42.1% TSH: 0.18  IMPRESSION:    ICD-10-CM   1. Precordial pain  R07.2     2. Equivocal stress test  R94.39     3. Nonrheumatic mitral valve regurgitation  I34.0     4. Hyperthyroidism  E05.90     5. Former cigarette smoker  Z87.891        RECOMMENDATIONS: Joy Cohen is a 55 y.o. female whose past medical history and cardiac risk factors include: History of thyrotoxicosis, currently hyperthyroidism, former smoker, GERD, Hiatal hernia.  Precordial pain Patient continues to have precordial discomfort which may be multifactorial. Currently being treated for heartburn/hiatal hernia. Most recent exercise treadmill stress test was reported to be equivocal.  This was discussed with both the patient and her husband at today's office visit.  Since she continues to have precordial discomfort I recommended either exercise nuclear stress test or coronary CTA for further evaluation and management.  Patient states that she would like  to hold off on additional testing at this time as both of the modalities require radiation and she would like to minimize exposure. Patient is asked to seek medical attention by going to the closest ER via EMS if she has worsening symptoms that increase in intensity, frequency, and/or duration or has effort related symptoms.  She verbalizes understanding.  Nonrheumatic mitral valve regurgitation Recommend echocardiogram in 3 years for follow-up as  long as she remains clinically stable.  Hyperthyroidism Currently on methimazole. Will defer to her provider for further management.  Former cigarette smoker Educated on the importance of continued smoking cessation.  FINAL MEDICATION LIST END OF ENCOUNTER: No orders of the defined types were placed in this encounter.   There are no discontinued medications.   Current Outpatient Medications:    Ascorbic Acid (VITAMIN C) 1000 MG tablet, Take 1,000 mg by mouth daily., Disp: , Rfl:    methimazole (TAPAZOLE) 5 MG tablet, Take 1 tablet by mouth daily at 12 noon., Disp: , Rfl:    Zinc 50 MG TABS, Take 1 tablet by mouth daily at 6 (six) AM., Disp: , Rfl:   No orders of the defined types were placed in this encounter.  Patient Instructions  Recommend follow-up echo in 3 years to reevaluate valvular heart disease. Patient states that she will call us back when she decides to proceed with either exercise treadmill stress test or coronary CTA.   --Continue cardiac medications as reconciled in final medication list. --Return in about 4 weeks (around 04/15/2021) for Follow up, Chest pain. Or sooner if needed. --Continue follow-up with your primary care physician regarding the management of your other chronic comorbid conditions.  Patient's questions and concerns were addressed to her satisfaction. She voices understanding of the instructions provided during this encounter.   This note was created using a voice recognition software as a result there may be grammatical errors inadvertently enclosed that do not reflect the nature of this encounter. Every attempt is made to correct such errors.  Rex Kras, [place], [facility]  Pager: [phone] Office: [phone]

## 2021-03-18 NOTE — Patient Instructions (Signed)
Recommend follow-up echo in 3 years to reevaluate valvular heart disease. Patient states that she will call us back when she decides to proceed with either exercise treadmill stress test or coronary CTA.

## 2021-04-15 DIAGNOSIS — R946 Abnormal results of thyroid function studies: Secondary | ICD-10-CM | POA: Diagnosis not present

## 2021-04-15 DIAGNOSIS — E0590 Thyrotoxicosis, unspecified without thyrotoxic crisis or storm: Secondary | ICD-10-CM | POA: Diagnosis not present

## 2021-04-15 DIAGNOSIS — Z6828 Body mass index (BMI) 28.0-28.9, adult: Secondary | ICD-10-CM | POA: Diagnosis not present

## 2021-04-15 DIAGNOSIS — E0500 Thyrotoxicosis with diffuse goiter without thyrotoxic crisis or storm: Secondary | ICD-10-CM | POA: Diagnosis not present

## 2021-04-16 ENCOUNTER — Ambulatory Visit: Payer: BC Managed Care – PPO | Admitting: Cardiology

## 2021-07-09 DIAGNOSIS — R42 Dizziness and giddiness: Secondary | ICD-10-CM | POA: Diagnosis not present

## 2021-07-09 DIAGNOSIS — E0590 Thyrotoxicosis, unspecified without thyrotoxic crisis or storm: Secondary | ICD-10-CM | POA: Diagnosis not present

## 2021-07-11 DIAGNOSIS — Z6829 Body mass index (BMI) 29.0-29.9, adult: Secondary | ICD-10-CM | POA: Diagnosis not present

## 2021-07-11 DIAGNOSIS — E0590 Thyrotoxicosis, unspecified without thyrotoxic crisis or storm: Secondary | ICD-10-CM | POA: Diagnosis not present

## 2021-07-11 DIAGNOSIS — E0500 Thyrotoxicosis with diffuse goiter without thyrotoxic crisis or storm: Secondary | ICD-10-CM | POA: Diagnosis not present

## 2021-09-27 IMAGING — CT CT ABD-PELV W/ CM
1 of 3 series · 13 of 32 positions shown, 19 images · IV contrast (APPLIED)
Comparison: None.

CLINICAL DATA: Right lower quadrant pain concern for appendicitis

EXAM:
CT ABDOMEN AND PELVIS WITH CONTRAST
TECHNIQUE: Multidetector CT imaging of the abdomen and pelvis was performed
using the standard protocol following bolus administration of
intravenous contrast.
CONTRAST:  100mL MTPIW1-USS IOPAMIDOL (MTPIW1-USS) INJECTION 61%

[Series 2: abd/pelvis w/cm · axial · 0.73mm/px · z∈[-469,-99]mm · 13 of 86 slices shown, 19 images]
[im 6/86  soft-tissue]
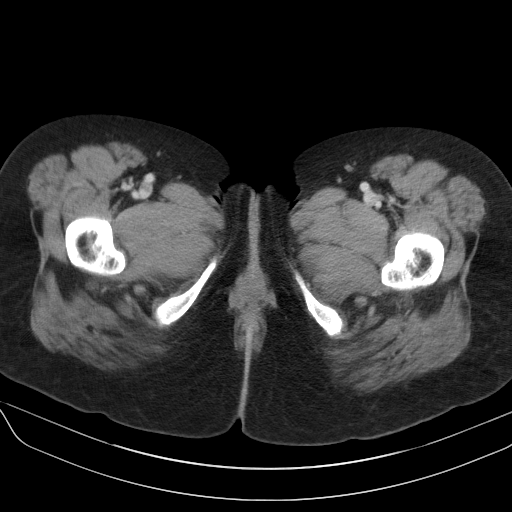
[im 6/86  bone]
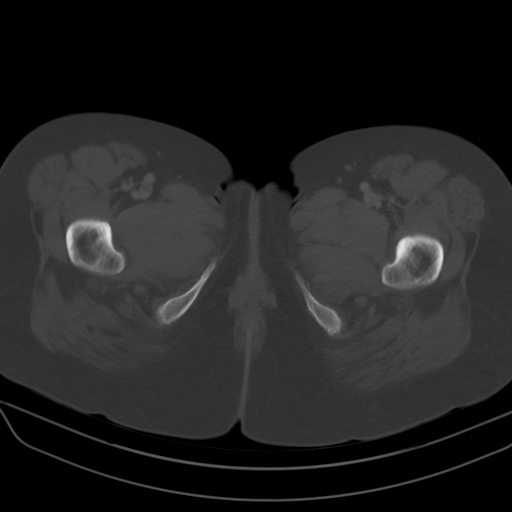
[im 12/86  soft-tissue]
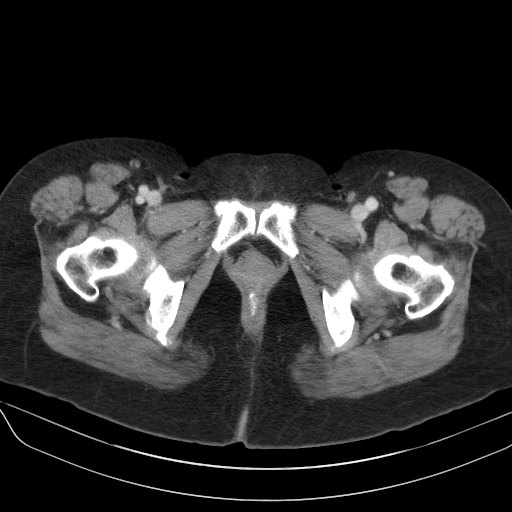
[im 18/86  soft-tissue]
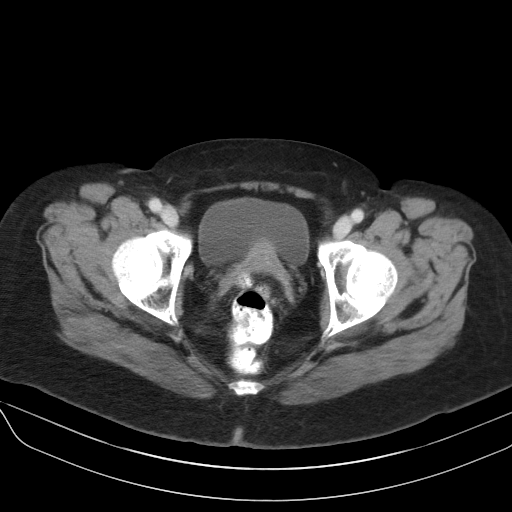
[im 23/86  soft-tissue]
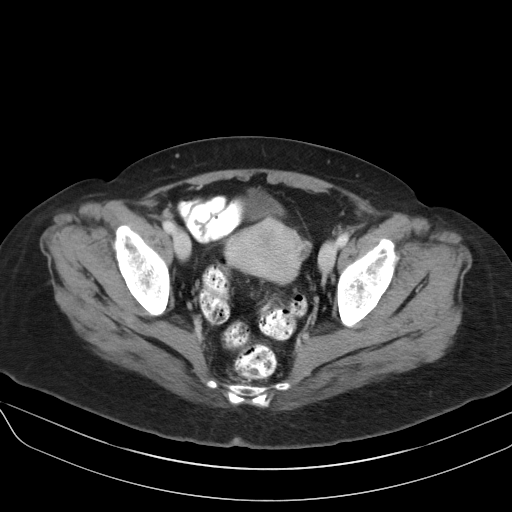
[im 29/86  soft-tissue]
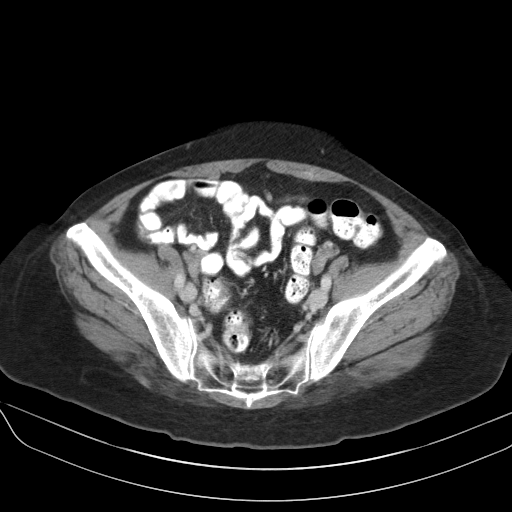
[im 35/86  soft-tissue]
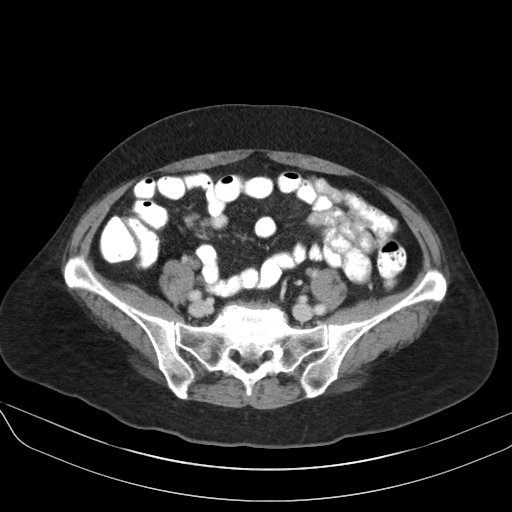
[im 46/86  soft-tissue]
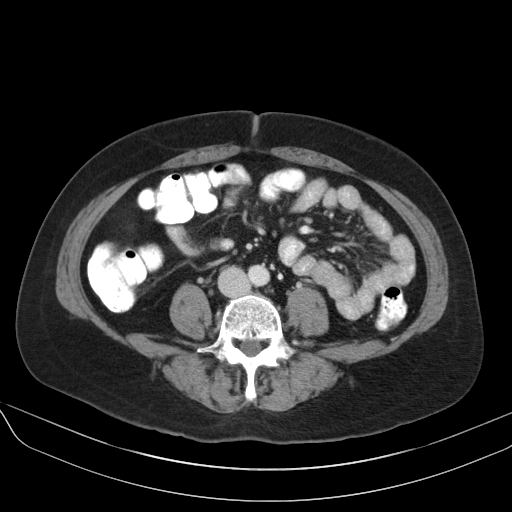
[im 52/86  soft-tissue]
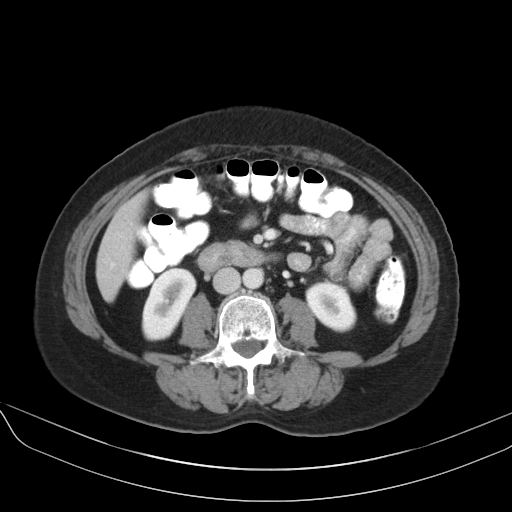
[im 57/86  soft-tissue]
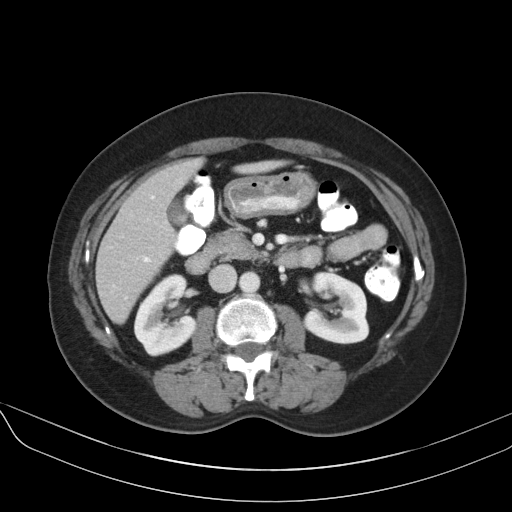
[im 57/86  bone]
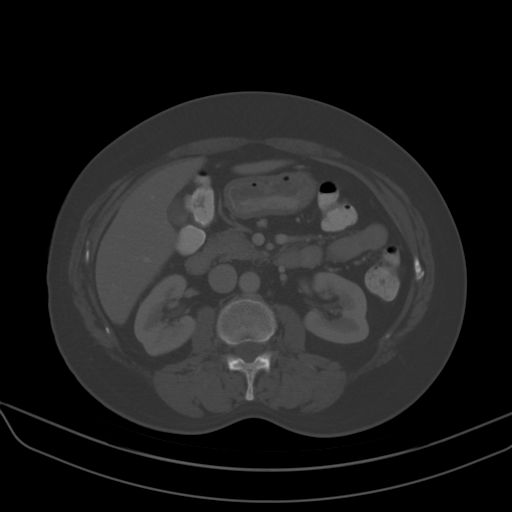
[im 63/86  soft-tissue]
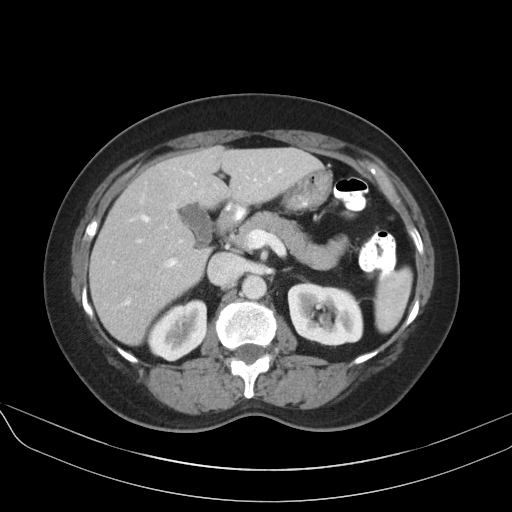
[im 63/86  lung]
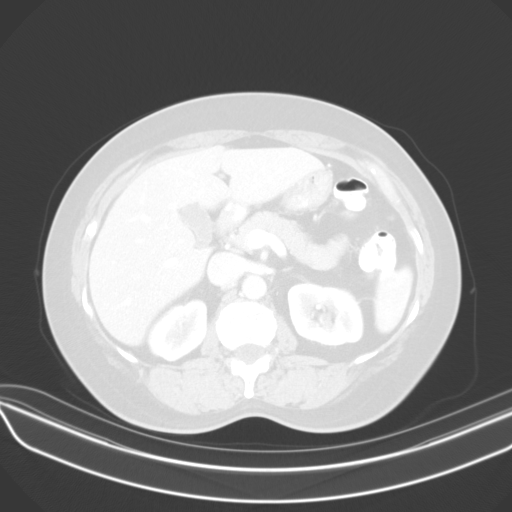
[im 69/86  soft-tissue]
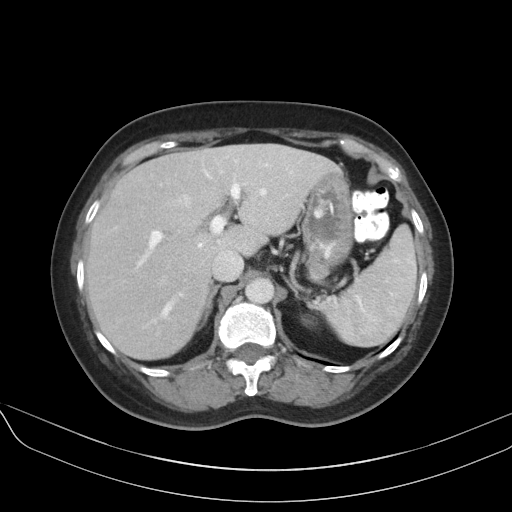
[im 69/86  lung]
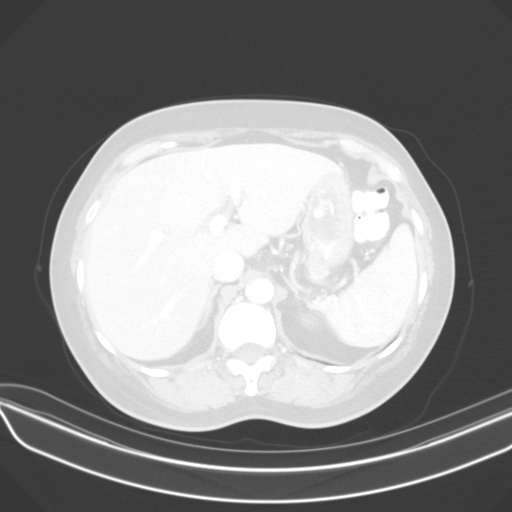
[im 74/86  soft-tissue]
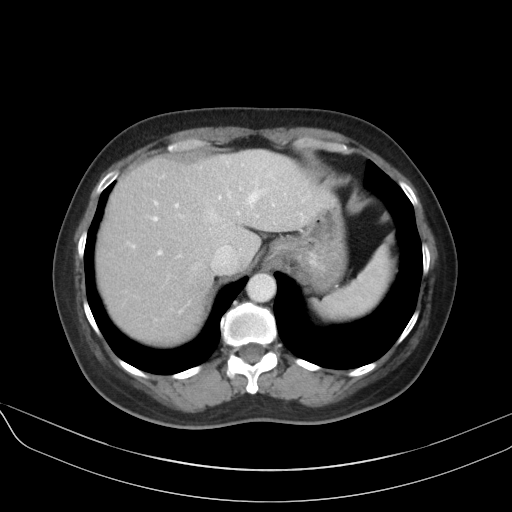
[im 74/86  lung]
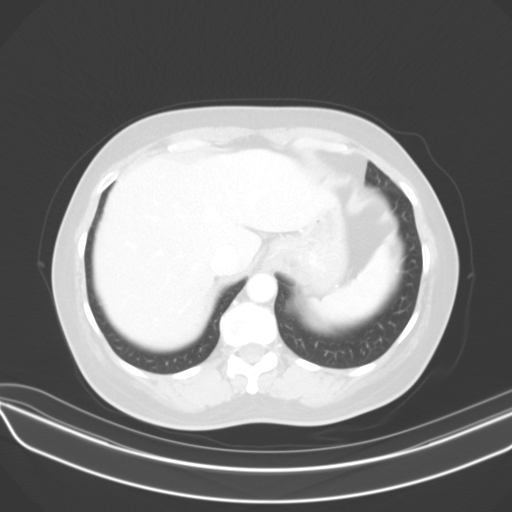
[im 80/86  soft-tissue]
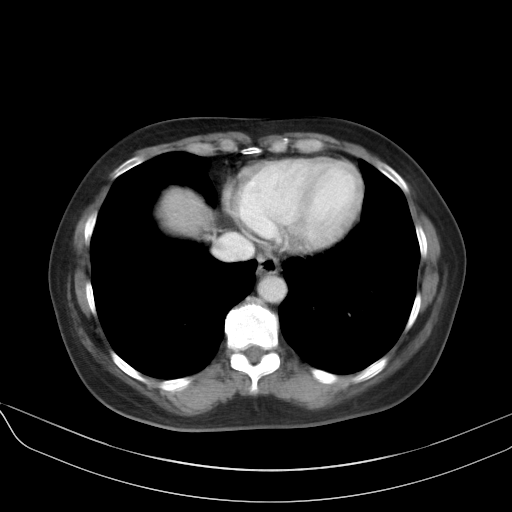
[im 80/86  lung]
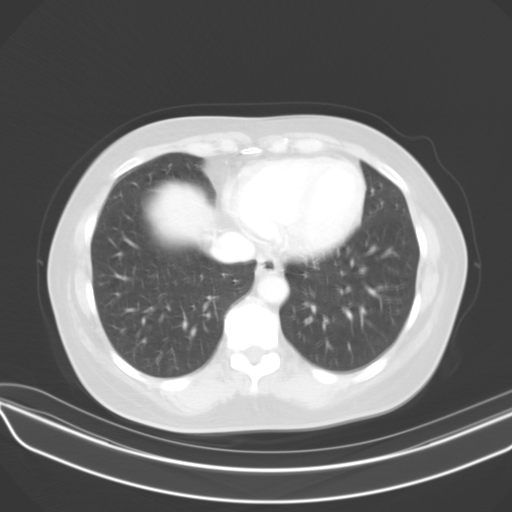

[13 of 32 positions shown; findings below may reference images not displayed]

FINDINGS: Lower chest: No acute abnormality.

Hepatobiliary: No suspicious hepatic lesion. Gallbladder is
unremarkable. No biliary ductal dilation.

Pancreas: Within normal limits.

Spleen: Within normal limits.

Adrenals/Urinary Tract: Adrenal glands are unremarkable. Kidneys are
normal, without renal calculi, solid enhancing lesion, or
hydronephrosis. Bladder is unremarkable for degree distension.

Stomach/Bowel: Radiopaque enteric contrast traverses the rectum.
Small hiatal hernia otherwise stomach is within normal limits.
Normal positioning of the duodenum/ligament of Treitz. No pathologic
dilation of small bowel. The appendix and terminal ileum appear
normal. No suspicious colonic wall thickening or mass like lesions.
No colonic diverticular disease.

Vascular/Lymphatic: No abdominal aortic aneurysm. No pathologically
enlarged abdominal or pelvic lymph nodes.

Reproductive: Uterus and bilateral adnexa are unremarkable.

Other: No abdominopelvic ascites.

Musculoskeletal: Straightening of the normal lumbar lordosis. L5-S1
discogenic disease. No acute osseous abnormality.
IMPRESSION: 1. No acute findings in the abdomen or pelvis, specifically no
evidence of acute appendicitis.
2. Small hiatal hernia.
3. L5-S1 discogenic disease.

## 2022-01-02 DIAGNOSIS — M79672 Pain in left foot: Secondary | ICD-10-CM | POA: Diagnosis not present

## 2022-01-02 DIAGNOSIS — M79671 Pain in right foot: Secondary | ICD-10-CM | POA: Diagnosis not present

## 2022-01-09 DIAGNOSIS — E0590 Thyrotoxicosis, unspecified without thyrotoxic crisis or storm: Secondary | ICD-10-CM | POA: Diagnosis not present

## 2022-01-10 DIAGNOSIS — H8303 Labyrinthitis, bilateral: Secondary | ICD-10-CM | POA: Diagnosis not present

## 2022-01-16 ENCOUNTER — Encounter: Payer: Self-pay | Admitting: Podiatry

## 2022-01-16 ENCOUNTER — Ambulatory Visit: Payer: BC Managed Care – PPO | Admitting: Podiatry

## 2022-01-16 DIAGNOSIS — E0500 Thyrotoxicosis with diffuse goiter without thyrotoxic crisis or storm: Secondary | ICD-10-CM | POA: Insufficient documentation

## 2022-01-16 DIAGNOSIS — E0590 Thyrotoxicosis, unspecified without thyrotoxic crisis or storm: Secondary | ICD-10-CM | POA: Insufficient documentation

## 2022-01-16 DIAGNOSIS — M19072 Primary osteoarthritis, left ankle and foot: Secondary | ICD-10-CM

## 2022-01-23 NOTE — Progress Notes (Signed)
  Subjective:  Patient ID: Joy Cohen, female    DOB: 08/15/1965,  MRN: [id number]  Chief Complaint  Patient presents with   Foot Pain    56 y.o. female presents with the above complaint.  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  Stated patient states painful to touch.  Is very mild in nature she wanted to get eval make sure that there is nothing.  She has brought outside x-rays and and wanted to get it evaluated she denies any diabetes she denies any other treatment options she states that she noticed it and wanted to make sure there is nothing concerning.   Review of Systems: Negative except as noted in the HPI. Denies N/V/F/Ch.  Past Medical History:  Diagnosis Date   GERD (gastroesophageal reflux disease)    Hiatal hernia    Thyroid disease     Current Outpatient Medications:    Ascorbic Acid (VITAMIN C) 1000 MG tablet, Take 1,000 mg by mouth daily., Disp: , Rfl:    Cholecalciferol (VITAMIN D3) LIQD, See admin instructions., Disp: , Rfl:    loratadine (CLARITIN) 5 MG chewable tablet, See admin instructions., Disp: , Rfl:    methimazole (TAPAZOLE) 5 MG tablet, Take 1 tablet by mouth daily at 12 noon., Disp: , Rfl:    naproxen (NAPROSYN) 500 MG tablet, Take 500 mg by mouth 2 (two) times daily., Disp: , Rfl:    Zinc 50 MG TABS, Take 1 tablet by mouth daily at 6 (six) AM., Disp: , Rfl:   Social History   Tobacco Use  Smoking Status Former   Packs/day: 1.00   Years: 5.00   Total pack years: 5.00   Types: Cigarettes   Quit date: 1995   Years since quitting: 28.5  Smokeless Tobacco Never    No Known Allergies Objective:  There were no vitals filed for this visit. There is no height or weight on file to calculate BMI. Constitutional Well developed. Well nourished.  Vascular Dorsalis pedis pulses palpable bilaterally. Posterior tibial pulses palpable bilaterally. Capillary refill normal to all digits.  No cyanosis or clubbing  noted. Pedal hair growth normal.  Neurologic Normal speech. Oriented to person, place, and time. Epicritic sensation to light touch grossly present bilaterally.  Dermatologic Nails well groomed and normal in appearance. No open wounds. No skin lesions.  Orthopedic: Pain on palpation about the left second DIPJ joint.  Pain with range of motion of the joint.  Great toe mild pain.  No pain at the PIPJ joint or metatarsophalangeal joint.   Radiographs: 3 views of skeletally mature adult left foot x-rays reviewed.  No signs of fractures noted no foreign body noted mild osteoarthritic changes noted to the second DIPJ joint. Assessment:   1. Arthritis of joint of lesser toe, left    Plan:  Patient was evaluated and treated and all questions answered.  Left second DIPJ joint arthritis mild -All questions and concerns were discussed with the patient extensive detail given the amount of pain that she is having I discussed that she will benefit from Voltaren gel topically.  If there is worsening of the pain we will discuss other options including steroid injection she states understanding.  I discussed shoe gear modification as well  No follow-ups on file.

## 2022-04-21 DIAGNOSIS — E0590 Thyrotoxicosis, unspecified without thyrotoxic crisis or storm: Secondary | ICD-10-CM | POA: Diagnosis not present

## 2022-04-24 DIAGNOSIS — S83282A Other tear of lateral meniscus, current injury, left knee, initial encounter: Secondary | ICD-10-CM | POA: Diagnosis not present

## 2022-04-30 DIAGNOSIS — M25562 Pain in left knee: Secondary | ICD-10-CM | POA: Diagnosis not present

## 2022-04-30 DIAGNOSIS — R531 Weakness: Secondary | ICD-10-CM | POA: Diagnosis not present

## 2022-04-30 DIAGNOSIS — R262 Difficulty in walking, not elsewhere classified: Secondary | ICD-10-CM | POA: Diagnosis not present

## 2022-05-05 DIAGNOSIS — M25562 Pain in left knee: Secondary | ICD-10-CM | POA: Diagnosis not present

## 2022-05-05 DIAGNOSIS — R262 Difficulty in walking, not elsewhere classified: Secondary | ICD-10-CM | POA: Diagnosis not present

## 2022-05-05 DIAGNOSIS — R531 Weakness: Secondary | ICD-10-CM | POA: Diagnosis not present

## 2022-05-08 DIAGNOSIS — M25562 Pain in left knee: Secondary | ICD-10-CM | POA: Diagnosis not present

## 2022-05-08 DIAGNOSIS — R531 Weakness: Secondary | ICD-10-CM | POA: Diagnosis not present

## 2022-05-08 DIAGNOSIS — R262 Difficulty in walking, not elsewhere classified: Secondary | ICD-10-CM | POA: Diagnosis not present

## 2022-05-12 DIAGNOSIS — E0500 Thyrotoxicosis with diffuse goiter without thyrotoxic crisis or storm: Secondary | ICD-10-CM | POA: Diagnosis not present

## 2022-05-12 DIAGNOSIS — E0590 Thyrotoxicosis, unspecified without thyrotoxic crisis or storm: Secondary | ICD-10-CM | POA: Diagnosis not present

## 2022-05-13 DIAGNOSIS — R262 Difficulty in walking, not elsewhere classified: Secondary | ICD-10-CM | POA: Diagnosis not present

## 2022-05-13 DIAGNOSIS — M25562 Pain in left knee: Secondary | ICD-10-CM | POA: Diagnosis not present

## 2022-05-13 DIAGNOSIS — R531 Weakness: Secondary | ICD-10-CM | POA: Diagnosis not present

## 2022-05-20 DIAGNOSIS — M25562 Pain in left knee: Secondary | ICD-10-CM | POA: Diagnosis not present

## 2022-05-20 DIAGNOSIS — R531 Weakness: Secondary | ICD-10-CM | POA: Diagnosis not present

## 2022-05-20 DIAGNOSIS — R262 Difficulty in walking, not elsewhere classified: Secondary | ICD-10-CM | POA: Diagnosis not present

## 2022-05-22 DIAGNOSIS — R262 Difficulty in walking, not elsewhere classified: Secondary | ICD-10-CM | POA: Diagnosis not present

## 2022-05-22 DIAGNOSIS — M25562 Pain in left knee: Secondary | ICD-10-CM | POA: Diagnosis not present

## 2022-05-22 DIAGNOSIS — R531 Weakness: Secondary | ICD-10-CM | POA: Diagnosis not present

## 2022-06-03 DIAGNOSIS — R262 Difficulty in walking, not elsewhere classified: Secondary | ICD-10-CM | POA: Diagnosis not present

## 2022-06-03 DIAGNOSIS — R531 Weakness: Secondary | ICD-10-CM | POA: Diagnosis not present

## 2022-06-03 DIAGNOSIS — M25562 Pain in left knee: Secondary | ICD-10-CM | POA: Diagnosis not present

## 2022-07-11 DIAGNOSIS — Z1331 Encounter for screening for depression: Secondary | ICD-10-CM | POA: Diagnosis not present

## 2022-07-11 DIAGNOSIS — Z0000 Encounter for general adult medical examination without abnormal findings: Secondary | ICD-10-CM | POA: Diagnosis not present

## 2022-07-11 DIAGNOSIS — E0590 Thyrotoxicosis, unspecified without thyrotoxic crisis or storm: Secondary | ICD-10-CM | POA: Diagnosis not present

## 2022-11-10 DIAGNOSIS — E0590 Thyrotoxicosis, unspecified without thyrotoxic crisis or storm: Secondary | ICD-10-CM | POA: Diagnosis not present

## 2022-12-03 DIAGNOSIS — E0590 Thyrotoxicosis, unspecified without thyrotoxic crisis or storm: Secondary | ICD-10-CM | POA: Diagnosis not present

## 2023-03-05 DIAGNOSIS — E0590 Thyrotoxicosis, unspecified without thyrotoxic crisis or storm: Secondary | ICD-10-CM | POA: Diagnosis not present
